# Patient Record
Sex: Male | Born: 1990 | Race: White | Hispanic: No | Marital: Single | Smoking: Never smoker
Health system: Southern US, Community
[De-identification: ages and names within clinical notes are randomized; demographics above are authoritative.]

## PROBLEM LIST (undated history)

## (undated) DIAGNOSIS — J309 Allergic rhinitis, unspecified: Secondary | ICD-10-CM

## (undated) DIAGNOSIS — J45909 Unspecified asthma, uncomplicated: Secondary | ICD-10-CM

## (undated) HISTORY — PX: OTHER SURGICAL HISTORY: SHX169

---
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
## 2009-10-03 ENCOUNTER — Emergency Department (HOSPITAL_COMMUNITY): Admission: EM | Admit: 2009-10-03 | Discharge: 2009-10-03 | Payer: Self-pay | Admitting: Family Medicine

## 2009-10-03 ENCOUNTER — Emergency Department (HOSPITAL_COMMUNITY): Admission: EM | Admit: 2009-10-03 | Discharge: 2009-10-03 | Payer: Self-pay | Admitting: Emergency Medicine

## 2010-05-22 LAB — HEPATIC FUNCTION PANEL
ALT: 47 U/L (ref 0–53)
AST: 30 U/L (ref 0–37)
Albumin: 3.2 g/dL — ABNORMAL LOW (ref 3.5–5.2)
Alkaline Phosphatase: 63 U/L (ref 39–117)
Bilirubin, Direct: 0.2 mg/dL (ref 0.0–0.3)
Indirect Bilirubin: 0.7 mg/dL (ref 0.3–0.9)
Total Bilirubin: 0.9 mg/dL (ref 0.3–1.2)
Total Protein: 7.6 g/dL (ref 6.0–8.3)

## 2010-05-22 LAB — BASIC METABOLIC PANEL
BUN: 8 mg/dL (ref 6–23)
CO2: 28 mEq/L (ref 19–32)
Calcium: 8.8 mg/dL (ref 8.4–10.5)
Chloride: 101 mEq/L (ref 96–112)
Creatinine, Ser: 0.74 mg/dL (ref 0.4–1.5)
GFR calc Af Amer: >60 mL/min (ref >60)
GFR calc non Af Amer: >60 mL/min (ref >60)
Glucose, Bld: 85 mg/dL (ref 70–99)
Potassium: 4.2 mEq/L (ref 3.5–5.1)
Sodium: 136 mEq/L (ref 135–145)

## 2010-05-22 LAB — LIPASE, BLOOD: Lipase: 21 U/L (ref 11–59)

## 2010-05-22 LAB — CBC
HCT: 26 % — ABNORMAL LOW (ref 39.0–52.0)
Hemoglobin: 8.9 g/dL — ABNORMAL LOW (ref 13.0–17.0)
MCH: 31.1 pg (ref 26.0–34.0)
MCHC: 34.3 g/dL (ref 30.0–36.0)
MCV: 90.9 fL (ref 78.0–100.0)
Platelets: 467 10*3/uL — ABNORMAL HIGH (ref 150–400)
RBC: 2.86 MIL/uL — ABNORMAL LOW (ref 4.22–5.81)
RDW: 12.5 % (ref 11.5–15.5)
WBC: 11.7 10*3/uL — ABNORMAL HIGH (ref 4.0–10.5)

## 2010-05-22 LAB — ABO/RH: ABO/RH(D): A POS

## 2010-05-22 LAB — URINALYSIS, ROUTINE W REFLEX MICROSCOPIC
Bilirubin Urine: NEGATIVE
Glucose, UA: NEGATIVE mg/dL
Hgb urine dipstick: NEGATIVE
Ketones, ur: NEGATIVE mg/dL
Nitrite: NEGATIVE
Protein, ur: NEGATIVE mg/dL
Specific Gravity, Urine: >1.046 — ABNORMAL HIGH (ref 1.005–1.030)
Urobilinogen, UA: 0.2 mg/dL (ref 0.0–1.0)
pH: 6.5 (ref 5.0–8.0)

## 2010-05-22 LAB — DIFFERENTIAL
Basophils Absolute: 0.1 10*3/uL (ref 0.0–0.1)
Basophils Relative: 1 % (ref 0–1)
Eosinophils Absolute: 0.5 10*3/uL (ref 0.0–0.7)
Eosinophils Relative: 4 % (ref 0–5)
Lymphocytes Relative: 13 % (ref 12–46)
Lymphs Abs: 1.6 10*3/uL (ref 0.7–4.0)
Monocytes Absolute: 1.8 10*3/uL — ABNORMAL HIGH (ref 0.1–1.0)
Monocytes Relative: 15 % — ABNORMAL HIGH (ref 3–12)
Neutro Abs: 7.8 10*3/uL — ABNORMAL HIGH (ref 1.7–7.7)
Neutrophils Relative %: 66 % (ref 43–77)

## 2010-05-22 LAB — TYPE AND SCREEN
ABO/RH(D): A POS
Antibody Screen: NEGATIVE

## 2010-11-30 LAB — CBC
HCT: 40.9
Hemoglobin: 14.3
MCHC: 34.9
MCV: 90.4
Platelets: 205
RBC: 4.52
RDW: 11.6
WBC: 5.5

## 2010-11-30 LAB — COMPREHENSIVE METABOLIC PANEL
ALT: 17
AST: 19
Albumin: 3.5
Alkaline Phosphatase: 55
BUN: 8
CO2: 30
Calcium: 9.1
Chloride: 102
Creatinine, Ser: 1.15
GFR calc Af Amer: >60
GFR calc non Af Amer: >60
Glucose, Bld: 105 — ABNORMAL HIGH
Potassium: 4.2
Sodium: 139
Total Bilirubin: 0.5
Total Protein: 7

## 2010-11-30 LAB — DIFFERENTIAL
Basophils Absolute: 0
Basophils Relative: 0
Eosinophils Absolute: 0.1
Eosinophils Relative: 2
Lymphocytes Relative: 26
Lymphs Abs: 1.4
Monocytes Absolute: 0.8
Monocytes Relative: 14 — ABNORMAL HIGH
Neutro Abs: 3.2
Neutrophils Relative %: 58

## 2010-11-30 LAB — LIPASE, BLOOD: Lipase: 15

## 2010-11-30 LAB — POCT RAPID STREP A: Streptococcus, Group A Screen (Direct): POSITIVE — AB
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
## 2012-04-21 ENCOUNTER — Emergency Department (HOSPITAL_COMMUNITY)
Admission: EM | Admit: 2012-04-21 | Discharge: 2012-04-23 | Disposition: A | Discharge status: Other Acute Inpatient Hospital | Payer: 59 | Attending: Emergency Medicine | Admitting: Emergency Medicine

## 2012-04-21 ENCOUNTER — Encounter (HOSPITAL_COMMUNITY): Payer: Self-pay | Admitting: *Deleted

## 2012-04-21 ENCOUNTER — Ambulatory Visit (HOSPITAL_COMMUNITY)
Admission: AD | Admit: 2012-04-21 | Discharge: 2012-04-21 | Disposition: A | Discharge status: Home / Self Care | Payer: 59 | Attending: Psychiatry | Admitting: Psychiatry

## 2012-04-21 DIAGNOSIS — F918 Other conduct disorders: Secondary | ICD-10-CM | POA: Insufficient documentation

## 2012-04-21 DIAGNOSIS — F1210 Cannabis abuse, uncomplicated: Secondary | ICD-10-CM

## 2012-04-21 DIAGNOSIS — J45909 Unspecified asthma, uncomplicated: Secondary | ICD-10-CM | POA: Insufficient documentation

## 2012-04-21 DIAGNOSIS — R454 Irritability and anger: Secondary | ICD-10-CM

## 2012-04-21 DIAGNOSIS — Z79899 Other long term (current) drug therapy: Secondary | ICD-10-CM | POA: Insufficient documentation

## 2012-04-21 HISTORY — DX: Allergic rhinitis, unspecified: J30.9

## 2012-04-21 HISTORY — DX: Unspecified asthma, uncomplicated: J45.909

## 2012-04-21 LAB — COMPREHENSIVE METABOLIC PANEL
ALT: 54 U/L — ABNORMAL HIGH (ref 0–53)
AST: 29 U/L (ref 0–37)
Albumin: 4.3 g/dL (ref 3.5–5.2)
Alkaline Phosphatase: 72 U/L (ref 39–117)
BUN: 9 mg/dL (ref 6–23)
CO2: 26 mEq/L (ref 19–32)
Calcium: 9.6 mg/dL (ref 8.4–10.5)
Chloride: 101 mEq/L (ref 96–112)
Creatinine, Ser: 0.94 mg/dL (ref 0.50–1.35)
GFR calc Af Amer: >90 mL/min (ref >90)
GFR calc non Af Amer: >90 mL/min (ref >90)
Glucose, Bld: 92 mg/dL (ref 70–99)
Potassium: 3.6 mEq/L (ref 3.5–5.1)
Sodium: 139 mEq/L (ref 135–145)
Total Bilirubin: 0.7 mg/dL (ref 0.3–1.2)
Total Protein: 8.3 g/dL (ref 6.0–8.3)

## 2012-04-21 LAB — CBC
HCT: 42.1 % (ref 39.0–52.0)
Hemoglobin: 15.4 g/dL (ref 13.0–17.0)
MCH: 32.6 pg (ref 26.0–34.0)
MCHC: 36.6 g/dL — ABNORMAL HIGH (ref 30.0–36.0)
MCV: 89.2 fL (ref 78.0–100.0)
Platelets: 335 10*3/uL (ref 150–400)
RBC: 4.72 MIL/uL (ref 4.22–5.81)
RDW: 11.6 % (ref 11.5–15.5)
WBC: 10 10*3/uL (ref 4.0–10.5)

## 2012-04-21 LAB — RAPID URINE DRUG SCREEN, HOSP PERFORMED
Amphetamines: POSITIVE — AB
Barbiturates: NOT DETECTED
Benzodiazepines: NOT DETECTED
Cocaine: NOT DETECTED
Opiates: NOT DETECTED
Tetrahydrocannabinol: POSITIVE — AB

## 2012-04-21 MED ORDER — LORAZEPAM 1 MG PO TABS
1.0000 mg | ORAL_TABLET | Freq: Three times a day (TID) | ORAL | Status: DC | PRN
  Administered 2012-04-21 – 2012-04-22 (×2): 1 mg via ORAL
  Filled 2012-04-21 (×2): qty 1

## 2012-04-21 MED ORDER — ZOLPIDEM TARTRATE 5 MG PO TABS
5.0000 mg | ORAL_TABLET | Freq: Every evening | ORAL | Status: DC | PRN
  Administered 2012-04-21 – 2012-04-22 (×2): 5 mg via ORAL
  Filled 2012-04-21 (×2): qty 1

## 2012-04-21 MED ORDER — IBUPROFEN 600 MG PO TABS
600.0000 mg | ORAL_TABLET | Freq: Three times a day (TID) | ORAL | Status: DC | PRN
  Administered 2012-04-22 – 2012-04-23 (×3): 600 mg via ORAL
  Filled 2012-04-21 (×3): qty 1

## 2012-04-21 MED ORDER — ONDANSETRON HCL 4 MG PO TABS: 4.0000 mg | ORAL_TABLET | Freq: Three times a day (TID) | ORAL | Status: DC | PRN

## 2012-04-21 MED ORDER — ACETAMINOPHEN 325 MG PO TABS: 650.0000 mg | ORAL_TABLET | ORAL | Status: DC | PRN

## 2012-04-21 MED ORDER — ALUM & MAG HYDROXIDE-SIMETH 200-200-20 MG/5ML PO SUSP: 30.0000 mL | ORAL | Status: DC | PRN

## 2012-04-21 NOTE — BH Assessment (Signed)
Assessment Note   [facility] is an 22 y.o. male, single, white, who presents to [facility] accompanied by father and girlfriend who, at Pt's request, did not participate in the assessment. Pt reports he has difficulty controlling his anger and he becomes frustrated easily. He states he is here today because he became upset last night with his father and girlfriend and had an anger outburst. He states he was irritable because he has a school assignment due Monday and couldn't concentrate. He became belligerent towards his girlfriend and when she decided to go home because of his behavior he lost his temper with her and his family. He states he yelled, said "things I didn't mean, but I know how to press their buttons," threw his glasses and his cell phone and hit walls. He says he made suicidal statements but only said them because he was angry, frustrated and knew it would get a reaction from his family and girlfriend. He states he felt guilty about his "tantrum" and agreed to get help because he doesn't want to act this way anymore. Pt states "When I was a teenager I would get mad and act out but now I'm 22 and I can't keep acting that way."   Pt denies feeling sad or depressed except after he has an anger outburst. He denies problems with sleep or appetite. He denies feelings of hopelessness. Pt denies current suicidal ideation or any history of suicidal gestures. He denies any history of intentional self-harm but states he has unintentionally hurt himself by punching walls. He denies current homicidal ideations but acknowledges making homicidal threats when angry. He denies ever being assaultive towards people but says he becomes verbally and physically threatening. He denies any psychotic symptoms but "my parents tell me all the time that I am paranoid. Everyone tip toes around me so they don't make me angry, but that makes me angry. I get mad because they don't understand what I am going through."   Pt  states he is taking more Vyvanse than prescribed. His physician, Dr. Cyndia [facility], prescribes 60 mg daily and Pt states he often takes two per day. He reports that he was diagnosed with ADD and prescribed Vyvanse when he was 16 and it was very helpful. Over the past year he says it is no longer effective, "I can take a Vyvanse and go to sleep." He states he cannot concentrate, which is a source of frustration. He says he spoke to Dr. Cyndia [facility] about this and was prescribed methylphenidate but due to problems with his insurance he has not been able to fill the prescription. Pt states his family thinks he is addicted to Vyvanse "but they don't understand my ADD and that I have to study." Pt also reports he has been smoking marijuana daily since he was age 37 and uses it to "calm down and control my anger." He denies abusing alcohol or using any other substances.  Pt states his primary stressor is school. 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 He states he cannot focus. He reports frequent conflicts with his girlfriend and parents as also being stressful. Pt denies legal problems.  Pt states he was in outpatient treatment for "anger problems" with Billy Mechanic, PhD, [facility] but stopped going in December. Pt states he was referred to Billy Amass, MD who diagnosed him with bipolar disorder and wanted to prescribe mood stabilizers. Pt states he doesn't believe he has bipolar  disorder because he doesn't feel he has manic episodes. He was prescribed a mood stabilizer as an adolescent and "all it made me do was gain weight." He denies any previous inpatient psychiatric treatment.  This LPC consulted with Billy Found, FNP who agreed Pt did not meet criteria for inpatient treatment. [facility] performed medical screening exam. Pt contracted for safety and agreed to follow up with outpatient referrals.   As Pt was leaving his father and girlfriend said  they needed to speak with this Clinical research associate. The Pt, father and girlfriend sat in the consult room and father and girlfriend said Pt had made both suicidal and homicidal threats and they were very concerned for his and their safety. Pt immediately became furious and started yelling at his father that they just wanted to have him locked up. Pt's father and girlfriend calmly expressed concern that he was abusing Vyvanse and that he had used a month's prescription in two weeks. Pt was yelling "you think I'm a meth addict!" Pt's father tried to calm Pt but Pt continued to verbally escalate. Pt's father stated that Pt had made threats last night to shoot his brother in the head and Pt yelled "No! I said I would shoot YOU in the head!" Pt's girlfriend tried to explain that she couldn't deal with Pt's anger outburst because her father had problems with anger outbursts and committed suicide. Pt jumped up, walked into the main hallway and said "Fine! I'll just kill myself!" and walked to the [facility] doors yelling "Go ahead! Lock me up! Do it!"   Pt was directed back to the consult room. Security officer, [facility] was notified to not allow Pt to leave the building. Consulted with [facility] and explained Pt needed to be placed under involuntary commitment. Consulted with [facility], [facility] who confirmed no [facility] bed was available. Security officer said Pt had tried to leave and was directed back to the consult room. This LPC called 911 and requested law enforcement assistance while IVC was processed.  Consulted with Dr. Orson [facility] who agreed to evaluate Pt and petition for involuntary commitment if necessary. After assessing Pt, Dr. Dan [facility] agreed Pt met criteria for involuntary commitment and signed IVC paperwork, however there was no notary in the building and no notary at [facility] available to come to [facility]. Pt was observed in the consult room with his chair turned to face the corner. This LPC drove to the magistrate's  office and petitioned for involuntary commitment.  Called [facility] and gave report to both Patty, Consulting civil engineer at [employer] and [employer]. Pt transported to [facility] by [employer]. Pt has not been accepted nor declined at [facility] at this time and should be considered for admission when he is medically cleared and a bed is available.    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 Asthma   . Allergic rhinitis, cause unspecified seaasonal    Allergic rhinitis   Axis IV: educational problems and problems with primary support group Axis V: GAF=35  Past Medical History:  Past Medical History  Diagnosis Date  . Asthma   . Allergic rhinitis, cause unspecified seaasonal    Allergic rhinitis    Past Surgical History  Procedure Laterality Date  . Other surgical history      sinus surgery  . Adnoids      adnoids removed  . Other surgical history  tubes placed in ears    Family History: No family history on file.  Social History:  reports that he has never smoked. He does not have any smokeless tobacco history on file. He reports that he uses illicit drugs (Marijuana). He reports that he does not drink alcohol.  Additional Social History:  Alcohol / Drug Use Pain Medications: Denies Prescriptions: Vyvanse Over the Counter: Denies History of alcohol / drug use?: Yes Substance #1 Name of Substance 1: Vyvanse 1 - Age of First Use: 16 1 - Amount (size/oz): 120 mg 1 - Frequency: daily 1 - Duration: 6 years 1 - Last Use / Amount: 04/20/2012, 60 mg Substance #2 Name of Substance 2: Marijuana 2 - Age of First Use: 13 2 - Amount (size/oz): 1 gram 2 - Frequency: daily 2 - Duration: 9 years 2 - Last Use / Amount: 04/20/2012, 1 gram  CIWA:   COWS:    Allergies:  Allergies  Allergen Reactions  . Amoxicillin Hives  . Cefzil (Cefprozil) Hives  . Septra (Sulfamethoxazole-Tmp Ds)  Hives    Home Medications:  (Not in a [facility] admission)  OB/GYN Status:  No LMP for male patient.  General Assessment Data Location of Assessment: [facility] Living Arrangements: Parent (Parents and brother (20)) Can pt return to current living arrangement?: Yes Admission Status: Involuntary (Voluntary on arrival) Is patient capable of signing voluntary admission?: Yes Transfer from: Home Referral Source: Self/Family/Friend  Education Status Is patient currently in school?: Yes Current Grade: 17 Highest grade of school patient has completed: 82 Name of school: [employer] person: Unknown  Risk to self Suicidal Ideation: Yes-Currently Present Suicidal Intent: No Is patient at risk for suicide?: Yes Suicidal Plan?: No Access to Means: No What has been your use of drugs/alcohol within the last 12 months?: Pt is abusing Vyvanse and marijuana daily Previous Attempts/Gestures: No How many times?: 0 Other Self Harm Risks: Pt will bang his head and hit walls Triggers for Past Attempts: Other personal contacts;Family contact Intentional Self Injurious Behavior: None Family Suicide History: No Recent stressful life event(s): Other (Comment) (School and relationship stress) Persecutory voices/beliefs?: No Depression: Yes Depression Symptoms: Feeling angry/irritable;Guilt Substance abuse history and/or treatment for substance abuse?: No Suicide prevention information given to non-admitted patients: Not applicable  Risk to Others Homicidal Ideation: Yes-Currently Present Thoughts of Harm to Others: Yes-Currently Present Comment - Thoughts of Harm to Others: Pt threatened to shoot his father in the head. Current Homicidal Intent: No Current Homicidal Plan: Yes-Currently Present Describe Current Homicidal Plan: Shoot his father Access to Homicidal Means: No (Pt denies access to a gun) Describe Access to Homicidal Means: Pt denies having a gun in the  home Identified Victim: Father History of harm to others?: No Assessment of Violence: On admission Violent Behavior Description: Pt destroyed property at his home, punched walls Does patient have access to weapons?: No Criminal Charges Pending?: No Does patient have a court date: No  Psychosis Hallucinations: None noted Delusions: None noted  Mental Status Report Appear/Hygiene: Other (Comment) (Casually dressed) Eye Contact: Good Motor Activity: Agitation;Restlessness Speech: Aggressive;Loud;Abusive Level of Consciousness: Alert;Irritable Mood: Angry;Guilty;Sullen Affect: Angry;Labile;Threatening Anxiety Level: Severe Thought Processes: Coherent;Relevant Judgement: Unimpaired Orientation: Person;Place;Time;Situation Obsessive Compulsive Thoughts/Behaviors: None  Cognitive Functioning Concentration: Decreased Memory: Recent Intact;Remote Intact IQ: Average Insight: Fair Impulse Control: Poor Appetite: Good Weight Loss: 0 Weight Gain: 0 Sleep: No Change Total Hours of Sleep: 8 Vegetative Symptoms: None  ADLScreening [facility]) Patient's cognitive ability adequate to safely complete daily  activities?: Yes Patient able to express need for assistance with ADLs?: Yes Independently performs ADLs?: Yes (appropriate for developmental age)  Abuse/Neglect [facility]) Physical Abuse: Denies Verbal Abuse: Denies Sexual Abuse: Denies  Prior Inpatient Therapy Prior Inpatient Therapy: No Prior Therapy Dates: None Prior Therapy Facilty/Provider(s): None Reason for Treatment: None  Prior Outpatient Therapy Prior Outpatient Therapy: Yes Prior Therapy Dates: 2013 Prior Therapy Facilty/Provider(s): [facility] Mechanic, PhD, [facility] Reason for Treatment: "anger problems"  ADL Screening (condition at time of admission) Patient's cognitive ability adequate to safely complete daily activities?: Yes Patient able to express need for assistance with ADLs?: Yes Independently  performs ADLs?: Yes (appropriate for developmental age) Weakness of Legs: None Weakness of Arms/Hands: None  Home Assistive Devices/Equipment Home Assistive Devices/Equipment: None    Abuse/Neglect Assessment (Assessment to be complete while patient is alone) Physical Abuse: Denies Verbal Abuse: Denies Sexual Abuse: Denies Exploitation of patient/patient's resources: Denies Self-Neglect: Denies     Advance Directives (For Healthcare) Advance Directive: Patient does not have advance directive;Patient would not like information Pre-existing out of facility DNR order (yellow form or pink MOST form): No Nutrition Screen- [facility] Patient's home diet: Regular Have you recently lost weight without trying?: No Have you been eating poorly because of a decreased appetite?: No Malnutrition Screening Tool Score: 0  Additional Information 1:1 In Past 12 Months?: No CIRT Risk: Yes Elopement Risk: Yes Does patient have medical clearance?: No     Disposition:  Disposition Disposition of Patient: Inpatient treatment program Type of inpatient treatment program: [facility] (Transferred to [facility] for medical clearance and holding)  On Site Evaluation by:   Reviewed with Physician: [facility] Found, FNP and [facility] Aloe, MD    Patsy Baltimore, Harlin Rain 04/21/2012 9:19 PM

## 2012-04-21 NOTE — ED Notes (Signed)
Pt informed that he has already been assessed at [facility] and they are waiting to run his info by MD for a bed. Pt asked how long he'll have to stay there advised it's different for everyone and it depends on how he interacts with his psychiatrist there and his participation in groups and activities. Pt remained calm and took the medications offered for sleep.

## 2012-04-21 NOTE — ED Notes (Signed)
Stated his dad said he needed help with his temper. Denies SI/HI
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
## 2012-04-22 MED ORDER — LAMOTRIGINE 25 MG PO TABS
25.0000 mg | ORAL_TABLET | Freq: Every day | ORAL | Status: DC
  Administered 2012-04-22 – 2012-04-23 (×2): 25 mg via ORAL
  Filled 2012-04-22 (×2): qty 1

## 2012-04-22 NOTE — ED Provider Notes (Signed)
[facility] is a 22 y.o. male here with IVC for agitation. Telepsych recommend admission. Patient denied at [facility] so will try other facilities. Sleeping comfortably this AM. No issues as per nursing overnight. Now pending psych placement.    Richardean Canal, MD 04/22/12 [phone]

## 2012-04-22 NOTE — Progress Notes (Signed)
Pt declined at present by [facility] and ACT will seek alternative placements since Tele Psych is recommending [facility].  Pt will be evaluated face to face on Monday AM by Dr. Queen Slough when he conducts rounds.  If pt still meets criteria placement pursuits will continue if not, the psychiatrist will make the appropriate recommendation.  Pt already assessed by ACT at [facility] and assessment in [facility]

## 2012-04-22 NOTE — BHH Counselor (Signed)
Patient has been declined at [facility] by Verne Spurr PA due to patient acuity. Patient's mood is too labile and unpredictable, he needs outpatient DBT. We have no programming for vyvanse abuse.

## 2012-04-22 NOTE — BHH Counselor (Signed)
First opinion completed and placed in pt's chart.  Evette Cristal, [place] Assessment Counselor

## 2012-04-22 NOTE — ED Notes (Signed)
telepsych consult called and faxed.

## 2012-04-22 NOTE — Progress Notes (Signed)
Nurse asked ACT to speak to pt father about options.  ACT was limited on what could be discussed due to confidentiality issues, but listened to father.  His concern was waiting in the ED and not getting tx he believed his son needed.  ACT affirmed father's perspective.  ACT indicated more would occur Monday and efforts would be made today.  Father did not want his son to "talk his way out of this and be discharged."  Father reported pt is followed by Sanjuana Letters at [facility].    ACT attempted to offer support to father of pt without discussing specifics of case with him without pt consent.  ED may get a consent to release signed by pt to discuss issues more openly with family.    Current plan is to pursue other [facility] facilities and perhaps revisit [facility] option if pt is cooperative in ED.

## 2012-04-23 NOTE — ED Notes (Addendum)
Chaplain saw pt on rounds.  Pt's girlfriend at bedside.  Chaplain introduced spiritual care as resource and provided support around pt's admission and transfer.  Pt is Administrator at [employer].  Has contacted professors to inform that he is in hospital.   Pt concerned about length of stay.  Informed pt that length of stay varied and that most important aspect was to be open with team about how he is feeling.   Will continue to follow as needed.  Please page as needs arise.    Belva Crome  MDiv

## 2012-04-23 NOTE — Progress Notes (Signed)
Pt received breakfast tray 

## 2012-04-23 NOTE — ED Provider Notes (Signed)
Pt accepted at [facility] by Dr Lester Kinsman, MD 04/23/12 [phone]

## 2012-04-23 NOTE — ED Provider Notes (Signed)
Patient sleeping but easily awakened. He states he feels fine. Evidently he had made threats to harm his family and also made suicidal statements.  According to the act team patient has been accepted at [facility] later this morning. Patient was informed of his admission and he seems agreeable. Patient is under IVC papers.  Devoria Albe, MD, FACEP   Ward Givens, MD 04/23/12 [phone]

## 2012-04-23 NOTE — BHH Counselor (Signed)
Per Christiane Ha, pt accepted to wait list by Dr. Les Pou. There will be a lot of discharges tomorrow am. Therefore, pt can be transported after 10 am. [phone] is # to call report. Pt will be going to [employer] at [employer].   Evette Cristal, [place] Assessment Counselor

## 2012-04-30 ENCOUNTER — Ambulatory Visit (HOSPITAL_COMMUNITY)
Admission: EM | Admit: 2012-04-30 | Discharge: 2012-04-30 | Disposition: A | Discharge status: Home / Self Care | Payer: 59 | Attending: Psychiatry | Admitting: Psychiatry

## 2012-04-30 DIAGNOSIS — F39 Unspecified mood [affective] disorder: Secondary | ICD-10-CM | POA: Insufficient documentation

## 2012-04-30 NOTE — BH Assessment (Signed)
Assessment Note   Billy Cooley is an 22 y.o. male. Pt reports he became agitated and verbally threatening to his father and his girlfriend on 04/21/12,  He also made suicidal threats. He has been abusing Vyvance since age 4 and has been seeing a therapist for his anger and ADHD problems for the past 6 years.  He was assessed at this facility and was sent to [facility] for inpatient treatment and was discharged on 04/27/12. Prior to his discharge arrangements were made for pt to attend Psych-iop and he had an appointment today to be assessed and to start psych-iop tomorrow.  No MSE needed.Pt reports he's had temper tantrum all his life and the abuse of th medication has kept him confused alone with the abuse of marijuana.  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  He has since taken a medical leave for school to get help. Pt denies being depressed but does want to change his ways of dealing with life stressors especially school.He denies h/i and is not psychotic. Pt did admit to smoking 1 marijuana joint last night..  He may subsequent need help dealing with substance abuse upon completion of this program.  Pt was given a suicide precaution pamphlet.      Axis I: Mood Disorder NOS Axis II: Deferred Axis III:  Past Medical History  Diagnosis Date  . Asthma   . Allergic rhinitis, cause unspecified seaasonal    Allergic rhinitis   Axis IV: educational problems, problems related to social environment and problems with primary support group Axis V: 51-60 moderate symptoms      Past Medical History:  Past Medical History  Diagnosis Date  . Asthma   . Allergic rhinitis, cause unspecified seaasonal    Allergic rhinitis    Past Surgical History  Procedure Laterality Date  . Other surgical history      sinus surgery  . Adnoids      adnoids removed  . Other surgical  history      tubes placed in ears    Family History: No family history on file.  Social History:  reports that he has never smoked. He does not have any smokeless tobacco history on file. He reports that he uses illicit drugs (Marijuana). He reports that he does not drink alcohol.  Additional Social History:  Alcohol / Drug Use Pain Medications: na Prescriptions: yes Over the Counter: na History of alcohol / drug use?: Yes Negative Consequences of Use: Personal relationships Withdrawal Symptoms: Agitation;Aggressive/Assaultive;Irritability;Patient aware of relationship between substance abuse and physical/medical complications Substance #1 Name of Substance 1: vyvance 1 - Age of First Use: 16 1 - Amount (size/oz): 120mg  1 - Frequency: daily 1 - Duration: 6 years 1 - Last Use / Amount: 04/20/12 Substance #2 Name of Substance 2: marijuana 2 - Age of First Use: 13 2 - Amount (size/oz): 1 gram 2 - Frequency: daily 2 - Duration: 9 years 2 - Last Use / Amount: 04/29/12  1 joint  CIWA:   COWS:    Allergies:  Allergies  Allergen Reactions  . Amoxicillin Hives  . Cefzil (Cefprozil) Hives  . Septra (Sulfamethoxazole-Tmp Ds) Hives    Home Medications:  (Not in a hospital admission)  OB/GYN Status:  No LMP for male patient.  General Assessment Data Location of Assessment: [facility] Assessment Services Living Arrangements: Parent Can pt  return to current living arrangement?: Yes Admission Status: Voluntary Is patient capable of signing voluntary admission?: Yes Transfer from: Home Referral Source: Other ([facility])  Education Status Highest grade of school patient has completed: 69 Name of school: [employer] person: Unknown  Risk to self Suicidal Ideation: No Suicidal Intent: No Is patient at risk for suicide?: No Suicidal Plan?: No Access to Means: No What has been your use of drugs/alcohol within the last 12 months?: VYVANCE,THC Previous  Attempts/Gestures: No How many times?: 0 Other Self Harm Risks: NA Triggers for Past Attempts: Other personal contacts;Family contact Intentional Self Injurious Behavior: None Family Suicide History: No Recent stressful life event(s): Conflict (Comment) (POOR ATTITUDE WITH FAMILY AND GIRLFRIEND) Persecutory voices/beliefs?: No Depression: No Depression Symptoms: Feeling angry/irritable Substance abuse history and/or treatment for substance abuse?: Yes Suicide prevention information given to non-admitted patients: Yes  Risk to Others Homicidal Ideation: No Thoughts of Harm to Others: No-Not Currently Present/Within Last 6 Months Comment - Thoughts of Harm to Others: WAS THREATENING FAMILY & GIRLFRIEND 04/21/12 Current Homicidal Intent: No Current Homicidal Plan: No Describe Current Homicidal Plan: NA Access to Homicidal Means: No Describe Access to Homicidal Means: NONE Identified Victim: NA History of harm to others?: No Assessment of Violence: None Noted Violent Behavior Description: NA Does patient have access to weapons?: No Criminal Charges Pending?: No Does patient have a court date: No  Psychosis Hallucinations: None noted Delusions: None noted  Mental Status Report Appear/Hygiene: Improved Eye Contact: Good Motor Activity: Freedom of movement Speech: Logical/coherent;Soft Level of Consciousness: Alert Mood: Other (Comment) (APPROPRIATE) Affect: Appropriate to circumstance Anxiety Level: Minimal Thought Processes: Coherent;Relevant Judgement: Unimpaired Orientation: Person;Place;Time;Situation Obsessive Compulsive Thoughts/Behaviors: None  Cognitive Functioning Concentration: Normal Memory: Recent Intact;Remote Intact IQ: Average Insight: Good Impulse Control: Good Appetite: Good Weight Loss:  (0) Weight Gain: 0 Sleep: No Change Total Hours of Sleep: 8 Vegetative Symptoms: None  ADLScreening [facility] Assessment Services) Patient's cognitive ability  adequate to safely complete daily activities?: Yes Patient able to express need for assistance with ADLs?: Yes Independently performs ADLs?: Yes (appropriate for developmental age)  Abuse/Neglect [facility]) Physical Abuse: Denies Verbal Abuse: Denies Sexual Abuse: Denies  Prior Inpatient Therapy Prior Inpatient Therapy: Yes Prior Therapy Dates: FEB 17-21, 2014 Prior Therapy Facilty/Provider(s): [facility] Reason for Treatment: MOOD D/O  Prior Outpatient Therapy Prior Outpatient Therapy: Yes Prior Therapy Dates: PAST 6 YEARS  Prior Therapy Facilty/Provider(s): Eyvonne Mechanic, PhD, [facility] Reason for Treatment: "anger problems"  ADL Screening (condition at time of admission) Patient's cognitive ability adequate to safely complete daily activities?: Yes Patient able to express need for assistance with ADLs?: Yes Independently performs ADLs?: Yes (appropriate for developmental age) Weakness of Legs: None Weakness of Arms/Hands: None     Therapy Consults (therapy consults require a physician order) PT Evaluation Needed: No OT Evalulation Needed: No SLP Evaluation Needed: No Abuse/Neglect Assessment (Assessment to be complete while patient is alone) Physical Abuse: Denies Verbal Abuse: Denies Sexual Abuse: Denies Exploitation of patient/patient's resources: Denies Self-Neglect: Denies Values / Beliefs Cultural Requests During Hospitalization: None Spiritual Requests During Hospitalization: None Consults Spiritual Care Consult Needed: No Social Work Consult Needed: No Merchant navy officer (For Healthcare) Advance Directive: Patient does not have advance directive;Patient would not like information Pre-existing out of facility DNR order (yellow form or pink MOST form): No    Additional Information 1:1 In Past 12 Months?: No CIRT Risk: No Elopement Risk: No Does patient have medical clearance?: No     Disposition:  Disposition Disposition  of Patient: Outpatient  treatment Type of outpatient treatment: Psych Intensive Outpatient  On Site Evaluation by:   Reviewed with Physician:     Hattie Perch Winford 04/30/2012 3:36 PM

## 2012-05-01 ENCOUNTER — Encounter (HOSPITAL_COMMUNITY): Payer: Self-pay

## 2012-05-01 ENCOUNTER — Other Ambulatory Visit (HOSPITAL_COMMUNITY): Payer: 59 | Attending: Psychiatry | Admitting: Psychiatry

## 2012-05-01 DIAGNOSIS — F329 Major depressive disorder, single episode, unspecified: Secondary | ICD-10-CM | POA: Insufficient documentation

## 2012-05-01 DIAGNOSIS — F3162 Bipolar disorder, current episode mixed, moderate: Secondary | ICD-10-CM

## 2012-05-01 DIAGNOSIS — F1210 Cannabis abuse, uncomplicated: Secondary | ICD-10-CM

## 2012-05-01 DIAGNOSIS — F39 Unspecified mood [affective] disorder: Secondary | ICD-10-CM | POA: Insufficient documentation

## 2012-05-01 NOTE — Progress Notes (Signed)
Patient ID: BEVERLEY SHERRARD, male   DOB: 1990-03-13, 22 y.o.   MRN: [id number] D:  This is a 22 yr old single caucasian male, who was transitioned from [place].  He was admitted there for four days due to severe agitation and communicating a threat.  States he made a threat to harm his girlfriend, father and himself.  Denies any SI/HI or A/V hallucinations.  Triggers/Stressors:  1)  Medication non-compliance:  Pt was abusing his Vyvanse.  Reports that he supposed to have been taking one tablet, but he was taking 2-3 tabs.  States he had been abusing it for ~ six months.  2)  Graduate school ([employer]:  Majoring in [place].  Due to poor grades/GPA (2.7) pt has lost his stipend.  3)  During Christmas break moved back in with parents due to conflict with roommate.  "Being back home has been a huge adjustment."  States he and parents argue a lot. Childhood:  "Good"  Denies any trauma or abuse.  Dx with ADD during senior year. Sibling:  Younger brother who also resides at parent's home. Drugs/ETOH:  Denies any ETOH.  Admits to history of using various drugs.  Currently admits to [facility] use.  Started smoking at age 51.  Use to smoke two grams.  Last time smoking two grams was two weeks ago.  States he smokes less than one gram now.  Admits to smoking THC this morning before coming to IOP. Pt has been seeing Drs. Tomasa Rand and [facility] on an outpatient basis for five years.  Pt will attend [facility]-IOP for two weeks.  Pt completed all forms.  Scored 7 on the burns.   A:  Oriented pt.  Informed Drs. Tomasa Rand and [facility] [place] of admit.  Encouraged support groups.  R:  Pt receptive.

## 2012-05-01 NOTE — Progress Notes (Signed)
    Daily Group Progress Note  Program: IOP  Group Time: 9:00-10:30 am   Participation Level: Minimal  Behavioral Response: Appropriate  Type of Therapy:  Process Group  Summary of Progress: Today was patients first day in the group. He was introduced, was attentive and observed the group process.      Group Time: 10:30 am - 12:00 pm   Participation Level:  Active  Behavioral Response: Appropriate  Type of Therapy: Psycho-education Group  Summary of Progress: Patient learned about the symptoms of depression and how to identify them and act early to prevent relapses.   Carman Ching, LCSW

## 2012-05-01 NOTE — Progress Notes (Signed)
Patient ID: Billy Cooley, male   DOB: 05-25-1990, 22 y.o.   MRN: [id number] D:  Placed call to [facility], spoke to Terri re: pre-cert.  She auth'd twelve MH-IOP days with auth # [id number].

## 2012-05-02 ENCOUNTER — Other Ambulatory Visit (HOSPITAL_COMMUNITY): Payer: 59 | Admitting: Psychiatry

## 2012-05-02 DIAGNOSIS — F329 Major depressive disorder, single episode, unspecified: Secondary | ICD-10-CM

## 2012-05-03 ENCOUNTER — Other Ambulatory Visit (HOSPITAL_COMMUNITY): Payer: 59 | Admitting: Psychiatry

## 2012-05-03 DIAGNOSIS — F329 Major depressive disorder, single episode, unspecified: Secondary | ICD-10-CM

## 2012-05-03 NOTE — Progress Notes (Signed)
    Daily Group Progress Note  Program: IOP  Group Time: 9:00-10:30 am   Participation Level: Active  Behavioral Response: Appropriate  Type of Therapy:  Psycho-education Group  Summary of Progress: Patient participated in a discussion Bipolar Disorder and Depression and shared personal experiences they have with their own symptoms. Patient learned how to identify them and act early on before the symptoms increase.       Group Time: 9:00-10:30 am   Participation Level:  Active  Behavioral Response: Appropriate  Type of Therapy: Psycho-education Group  Summary of Progress:  Patient learned about Anxiety and how to recognize symptoms and act quickly using various coping skills to manage the symptoms.   Carman Ching, LCSW

## 2012-05-04 ENCOUNTER — Other Ambulatory Visit (HOSPITAL_COMMUNITY): Payer: 59

## 2012-05-04 ENCOUNTER — Telehealth (HOSPITAL_COMMUNITY): Payer: Self-pay | Admitting: Psychiatry

## 2012-05-04 NOTE — Progress Notes (Signed)
    Daily Group Progress Note  Program: IOP  Group Time: 9:00-10:30 am   Participation Level: Active  Behavioral Response: Appropriate  Type of Therapy:  Process Group  Summary of Progress: Patient continues to work on his anger. He told the group that he feels that he is doing well with his anger and has not had any angry outbursts lately. He feels that his stress is the main trigger and struggles with coping skills for his anger. In the past he has used drugs or alcohol as coping measures but wants to learn better coping skills. He plans to work on Producer, television/film/video new skills through this group.      Group Time: 10:30 am - 12:00 pm   Participation Level:  Active  Behavioral Response: Appropriate  Type of Therapy: Psycho-education Group  Summary of Progress: Patient learned the DBT skill of Mindfulness and how to use it for emotion regulation and to be more effective in daily interactions.   Carman Ching, LCSW

## 2012-05-06 NOTE — Progress Notes (Signed)
Psychiatric Assessment Adult  Patient Identification:  KINGSLY KLOEPFER Date of Evaluation:  05/01/12 Chief Complaint: History of Chief Complaint:  22 yr old single caucasian male, who was transitioned from [place]. He was admitted there for four days due to severe agitation and communicating a threat. States he made a threat to harm his girlfriend, father and himself. Denies any SI/HI or A/V hallucinations. 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 Reports that he supposed to have been taking one tablet, but he was taking 2-3 tabs. States he had been abusing it for ~ six months. 2) Graduate school ([employer]: Majoring in [place]. Due to poor grades/GPA (2.7) pt has lost his stipend. 3) During Christmas break moved back in with parents due to conflict with roommate. "Being back home has been a huge adjustment." States he and parents argue a lot.  Childhood: "Good" Denies any trauma or abuse. Dx with ADD during senior year.  Sibling: Younger brother who also resides at parent's home.  Drugs/ETOH: Denies any ETOH. Admits to history of using various drugs. Currently admits to [facility] use. Started smoking at age 1. Use to smoke two grams. Last time smoking two grams was two weeks ago. States he smokes less than one gram now. Admits to smoking THC this morning before coming to IOP.  Pt has been seeing Drs. Tomasa Rand and [place] on an outpatient basis for five years   [facility] made the following med changes. -they DC the Vyvanse, Lamictal, and was started on Strattera 40 mg po q d and Trileptal 300mg  TID.  HPI Review of Systems Physical Exam  Depressive Symptoms: psychomotor agitation, feelings of worthlessness/guilt, difficulty concentrating, hopelessness, anxiety,  (Hypo) Manic Symptoms:   Elevated Mood:  No Irritable Mood:  Yes Grandiosity:  No Distractibility:  Yes Labiality of Mood:  Yes Delusions:  No Hallucinations:  No Impulsivity:   Yes Sexually Inappropriate Behavior:  No Financial Extravagance:  No Flight of Ideas:  No  Anxiety Symptoms: Excessive Worry:  Yes Panic Symptoms:  Yes Agoraphobia:  No Obsessive Compulsive: No  Symptoms: None, Specific Phobias:  No Social Anxiety:  Yes  Psychotic Symptoms:  Hallucinations: No None Delusions:  No Paranoia:  No   Ideas of Reference:  No  PTSD Symptoms:NONE Traumatic Brain Injury: No   Past Psychiatric History: Diagnosis: ADHD, Bipolar disorder  Hospitalizations:   Outpatient Care: Dr Tomasa Rand for meds and Dr Letta Kocher for therapy.  Substance Abuse Care:   Self-Mutilation:   Suicidal Attempts:   Violent Behaviors:    Past Medical History:   Past Medical History  Diagnosis Date  . Asthma   . Allergic rhinitis, cause unspecified seaasonal    Allergic rhinitis   History of Loss of Consciousness:  No Seizure History:  No Cardiac History:  No Allergies:   Allergies  Allergen Reactions  . Amoxicillin Hives  . Cefzil (Cefprozil) Hives  . Septra (Sulfamethoxazole-Tmp Ds) Hives   Current Medications:  Current Outpatient Prescriptions  Medication Sig Dispense Refill  . atomoxetine (STRATTERA) 40 MG capsule Take 40 mg by mouth daily.      Marland Kitchen levocetirizine (XYZAL) 5 MG tablet Take 5 mg by mouth every morning.      . montelukast (SINGULAIR) 10 MG tablet Take 10 mg by mouth every morning.      . Oxcarbazepine (TRILEPTAL) 300 MG tablet Take 300 mg by mouth 3 (three) times daily.       No current facility-administered medications for this visit.    Previous  Psychotropic Medications:  Medication Dose   Strattera,Trileptal, Vyvanse, Addereall                       Substance Abuse History in the last 12 months: Substance Age of 1st Use Last Use Amount Specific Type  Nicotine      Alcohol      Cannabis teens today 1 GM /day   Opiates      Cocaine unk unk    Methamphetamines unk 1 yr ago unk   LSD unk 2 yrs ago unk   Ecstasy       Benzodiazepines      Caffeine      Inhalants      Others:                          Medical Consequences of Substance Abuse: none  Legal Consequences of Substance Abuse: none  Family Consequences of Substance Abuse: family conflicts  Blackouts:  No DT's:  No Withdrawal Symptoms:  No None  Social History: Current Place of Residence: Lives with his parents in [place]. Place of Birth:  Family Members:  Marital Status:  Single Children:   Sons:   Daughters:  Relationships:  Education:  [employer] Problems/Performance:  Religious Beliefs/Practices:  History of Abuse: none Teacher, music History:  None. Legal History: none Hobbies/Interests:   Family History:   Family History  Problem Relation Age of Onset  . Alcohol abuse Paternal Uncle   . Drug abuse Paternal Uncle   . Alcohol abuse Maternal Grandfather     Mental Status Examination/Evaluation: Objective:  Appearance: Casual  Eye Contact::  Fair  Speech:  Normal Rate  Volume:  Normal  Mood:  Dysphoric and anxious  Affect:  Constricted and Depressed  Thought Process:  Goal Directed  Orientation:  Full (Time, Place, and Person)  Thought Content:  WDL  Suicidal Thoughts:  No  Homicidal Thoughts:  No  Judgement:  Fair  Insight:  Fair  Psychomotor Activity:  Normal  Akathisia:  No  Handed:  Right  AIMS (if indicated):    Assets:  Communication Skills Desire for Improvement Physical Health Resilience Social Support    Laboratory/X-Ray Psychological Evaluation(s)   done in the hospital     Assessment:  Axis I: Major Depression, Recurrent severe  AXIS I ADHD, combined type, Major Depression, Recurrent severe and Substance Abuse  AXIS II Deferred  AXIS III Past Medical History  Diagnosis Date  . Asthma   . Allergic rhinitis, cause unspecified seaasonal    Allergic rhinitis     AXIS IV educational problems, housing problems, other psychosocial or environmental  problems, problems related to social environment and problems with primary support group  AXIS V 51-60 moderate symptoms   Treatment Plan/Recommendations:  Plan of Care: start IOP  Laboratory:  done on the inpatient unit  Psychotherapy: group and individual therapy  Medications: continue Trileptal and strattera at the above doses.  Routine PRN Medications:  No  Consultations:   Safety Concerns:  none  Other:  Estimated LOS- 2 weeks     Margit Banda, MD  05/01/12 . 4.00 PM.

## 2012-05-07 ENCOUNTER — Other Ambulatory Visit (HOSPITAL_COMMUNITY): Payer: 59 | Attending: Psychiatry | Admitting: Psychiatry

## 2012-05-07 DIAGNOSIS — F329 Major depressive disorder, single episode, unspecified: Secondary | ICD-10-CM

## 2012-05-07 DIAGNOSIS — F3110 Bipolar disorder, current episode manic without psychotic features, unspecified: Secondary | ICD-10-CM | POA: Insufficient documentation

## 2012-05-07 NOTE — Progress Notes (Signed)
    Daily Group Progress Note  Program: IOP  Group Time: 9:00-10:30 am   Participation Level: Active  Behavioral Response: Appropriate  Type of Therapy:  Process Group  Summary of Progress: Patient was a few minutes late and expressed agitation with several small things he felt had already gone wrong this morning that contributed to his "foul" mood. He stated he did not sleep well last night and that may be contributing to his irritability. After listening to others share, he stated he needs to put his life in perspective because others have way worse stressors than he does.      Group Time: 10:30 am - 12:00 pm   Participation Level:  Active  Behavioral Response: Appropriate  Type of Therapy: Psycho-education Group  Summary of Progress: Patient participated in a group on grief and loss and identified losses impacting wellness and discussed healthy ways to grieve.  Carman Ching, LCSW

## 2012-05-08 ENCOUNTER — Telehealth (HOSPITAL_COMMUNITY): Payer: Self-pay | Admitting: Psychiatry

## 2012-05-08 ENCOUNTER — Other Ambulatory Visit (HOSPITAL_COMMUNITY): Payer: 59

## 2012-05-09 ENCOUNTER — Other Ambulatory Visit (HOSPITAL_COMMUNITY): Payer: 59 | Admitting: Psychiatry

## 2012-05-09 DIAGNOSIS — F329 Major depressive disorder, single episode, unspecified: Secondary | ICD-10-CM

## 2012-05-09 MED ORDER — ATOMOXETINE HCL 40 MG PO CAPS: 40.0000 mg | ORAL_CAPSULE | Freq: Two times a day (BID) | ORAL | Status: DC

## 2012-05-09 NOTE — Progress Notes (Signed)
Patient ID: Billy Cooley, male   DOB: 04-Sep-1990, 22 y.o.   MRN: [id number] Pt seen states hes anxious. Discussed discontinuing caffeine and quitting marijuana . And incorporating exercise and a healthy life style.No si/ hi. No hallucinations/ delusions.

## 2012-05-09 NOTE — Progress Notes (Signed)
    Daily Group Progress Note  Program: IOP  Group Time: 9:00-10:30 am   Participation Level: Active  Behavioral Response: Appropriate  Type of Therapy:  Process Group  Summary of Progress: Patient arrived twenty minutes late to group again today despite being asked to come on time. The tx team discussed this as a treatment issue today and it was agreed upon that patient is to be notified that being on time is a necessary part of his treatment and one more tardy will mean discharge from the program. Patient described struggling in school and not being sure he can return. He identified with the feeling of procrastination and motivation.      Group Time: 10:30 am - 12:00 pm   Participation Level:  Active  Behavioral Response: Appropriate  Type of Therapy: Psycho-education Group  Summary of Progress: Patient participated in a discussion on mindfulness in a follow up to the presentation the other day. Patient learned how to sit with painful feelings mindfully with an acceptance instead of a rejection of them.    Carman Ching, LCSW

## 2012-05-10 ENCOUNTER — Telehealth (HOSPITAL_COMMUNITY): Payer: Self-pay | Admitting: Psychiatry

## 2012-05-10 ENCOUNTER — Other Ambulatory Visit (HOSPITAL_COMMUNITY): Payer: 59

## 2012-05-11 ENCOUNTER — Other Ambulatory Visit (HOSPITAL_COMMUNITY): Payer: 59

## 2012-05-14 ENCOUNTER — Other Ambulatory Visit (HOSPITAL_COMMUNITY): Payer: 59 | Admitting: Psychiatry

## 2012-05-14 DIAGNOSIS — F329 Major depressive disorder, single episode, unspecified: Secondary | ICD-10-CM

## 2012-05-14 NOTE — Progress Notes (Signed)
    Daily Group Progress Note  Program: IOP  Group Time: 9:00-10:30 am   Participation Level: Active  Behavioral Response: Appropriate  Type of Therapy:  Process Group  Summary of Progress: Patient reports feeling "good" today. He talked about his struggle with doing well in high school academically with minimal effort and how this caused him to struggle when he began college because the ways he managed classes was not effective anymore. He described the unhealthy coping skills he used that compounded the problem until he stopped attending. He described wanting to learn how to better manage his time and be more effective in college so he can return.      Group Time: 10:30 am - 12:00 pm   Participation Level:  Active  Behavioral Response: Appropriate  Type of Therapy: Psycho-education Group  Summary of Progress: Patient participated in a group on grief and loss and identified personal losses and appropriate grieving strategies.   Carman Ching, LCSW

## 2012-05-15 ENCOUNTER — Other Ambulatory Visit (HOSPITAL_COMMUNITY): Payer: 59 | Admitting: Psychiatry

## 2012-05-16 ENCOUNTER — Other Ambulatory Visit (HOSPITAL_COMMUNITY): Payer: 59 | Admitting: Psychiatry

## 2012-05-16 ENCOUNTER — Telehealth (HOSPITAL_COMMUNITY): Payer: Self-pay | Admitting: Psychiatry

## 2012-05-16 NOTE — Progress Notes (Signed)
    Daily Group Progress Note  Program: IOP  Group Time: 9:00-10:30 am   Participation Level: None  Behavioral Response: None  Type of Therapy:  Process Group  Summary of Progress: Patient arrived twenty minutes late again today after several discussions about needing to be on time. Patient was asked to wait in the lobby until this group ended as a consequence for his continued lateness. Patent met with the case manager to discuss his tardiness and the reason for the intervention.      Group Time: 10:30 am - 12:00 pm   Participation Level:  None  Behavioral Response: None  Type of Therapy: Psycho-education Group  Summary of Progress: Patient chose to leave and was not present for this group.   Carman Ching, LCSW

## 2012-05-16 NOTE — Telephone Encounter (Signed)
Pt arrived late for group today.  As an intervention had to sit in the lobby until break time.  According to pt, he became ill and left. Scheduled d/c tomorrow.

## 2012-05-16 NOTE — Progress Notes (Signed)
    Daily Group Progress Note  Program: IOP  Group Time: 9:00-10:30 am   Participation Level: Active  Behavioral Response: Appropriate  Type of Therapy:  Process Group  Summary of Progress: Patient arrived thirty minutes late again for group today and tardiness has been his normal after repeated discussions with him regarding a need to be on time. Patient stated he struggles with procrastination and this is also what he had difficulty with in college. Members discussed their need with him to be on time and make group a priority and he agreed.      Group Time: 10:30 am - 12:00 pm   Participation Level:  Active  Behavioral Response: Appropriate  Type of Therapy: Psycho-education Group  Summary of Progress: Patient learned the ACCEPTS portion of the DBT skill of Distress Tolerance and how to use this to manage difficult feelings.    Carman Ching, LCSW

## 2012-05-17 ENCOUNTER — Other Ambulatory Visit (HOSPITAL_COMMUNITY): Payer: 59 | Admitting: Psychiatry

## 2012-05-17 DIAGNOSIS — F329 Major depressive disorder, single episode, unspecified: Secondary | ICD-10-CM

## 2012-05-17 NOTE — Progress Notes (Signed)
Discharge Note  Patient:  Billy Cooley is an 22 y.o., male DOB:  03/29/1990  Date of Admission:  (Not on file)  Date of Discharge:  05/17/12  Reason for Admission: Patient is 22 year old Caucasian male who was transitioned from inpatient care.  He was admitted at old [facility] because of increased agitation and to be getting pregnant.  He was threatening to harm his girlfriend father and himself.  There were no psychotic symptoms present.  He was recommended to do intensive outpatient program.  Hospital Course: Patient was admitted in intensive outpatient program.  He was prescribed mood stabilizer Trileptal however patient is not taking the medication.  He does not believe he has any bipolar disorder.  He believe he has ADD.  He admitted taking moderate dose of Vyvanse of the stimulants which was causing his anger.  He's been off from these medication.  He is taking the Strattera which is given by Dr. Karie Schwalbe.  He is also not happy with his previous psychiatrist Dr. Tomasa Rand.  He scheduled to see Dr. Jennelle Human.  He finished the program without any issue.  He felt much improvement in better in group.  He admitted increase coping and social skills.  At this time he does not have any aggression or threatening behavior.  Mental Status at Discharge: Patient is casually dressed and groomed.  His his speech is clear and coherent.  His thought process is logical linear and goal-directed.  He denies any active or passive suicidal thoughts or homicidal thoughts.  He denies any auditory or visual hallucination.  He has some grandiosity but there were no paranoia or delusion present at this time.  There were no flight of ideas or any loose association.  His psychomotor activity is okay.  There were no tremors or shakes.  His insight judgment is fair.  His impulse control is okay.  Lab Results: No results found for this or any previous visit (from the past 48 hour(s)).  Current outpatient  prescriptions:atomoxetine (STRATTERA) 40 MG capsule, Take 1 capsule (40 mg total) by mouth 2 (two) times daily., Disp: 60 capsule, Rfl: 0;  levocetirizine (XYZAL) 5 MG tablet, Take 5 mg by mouth every morning., Disp: , Rfl: ;  montelukast (SINGULAIR) 10 MG tablet, Take 10 mg by mouth every morning., Disp: , Rfl: ;  Oxcarbazepine (TRILEPTAL) 300 MG tablet, Take 300 mg by mouth 3 (three) times daily., Disp: , Rfl:   Axis Diagnosis:  Axis I: Bipolar, Manic and ADD by history   Level of Care:  OP  Discharge destination:  Home  Is patient on multiple antipsychotic therapies at discharge:  No    Has Patient had three or more failed trials of antipsychotic monotherapy by history:  No  Patient phone:  [phone] (home)  Patient address:   [address] [place] [address],   Follow-up recommendations:  Activity:  As tolerated Diet:  Unchanged Other:  Patient will followup with Dr. Jennelle Human and Dr. Letta Kocher next week.  Comments:  None  The patient received suicide prevention pamphlet:  Yes Belongings returned:  Clothing, Medications and Valuables  ARFEEN,SYED T. 05/17/2012, 11:00 AM

## 2012-05-17 NOTE — Progress Notes (Signed)
    Daily Group Progress Note  Program: IOP  Group Time: 9:00-10:30 am   Participation Level: Active  Behavioral Response: Appropriate  Type of Therapy:  Process Group  Summary of Progress: Today is patients last day in the group. He reports making good progress with his depression and states he learned how to change his thoughts to try to see the positive in stressful situations to reduce some of the stress. He also relied heavily on his spirituality as form of support. He arrived on time for group for the first time today and received compliments from group members on this achievement.      Group Time: 10:30 am - 12:00 pm   Participation Level:  Active  Behavioral Response: Appropriate  Type of Therapy: Psycho-education Group  Summary of Progress: Patient participated in goodbye to member ceremonies and practiced the skill of having healthy closure.   Carman Ching, LCSW

## 2012-05-17 NOTE — Progress Notes (Signed)
Patient ID: CURRY SEEFELDT, male   DOB: 1990/11/12, 22 y.o.   MRN: [id number] This is a 22 yr old single caucasian male, who was transitioned from [facility]. He was admitted there for four days due to severe agitation and communicating a threat. States he made a threat to harm his girlfriend, father and himself. Denies any SI/HI or A/V hallucinations. 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 Reports that he supposed to have been taking one tablet, but he was taking 2-3 tabs. States he had been abusing it for ~ six months. 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 Due to poor grades/GPA (2.7) pt has lost his stipend. 3) During Christmas break moved back in with parents due to conflict with roommate. "Being back home has been a huge adjustment." States he and parents argue a lot. Pt completed [facility] today. He states he has found a lot of new tools to help him with his anger and frustration. Pt feels the group was affective and assisted him with many of his stressors. He denies any SI/HI or A/V hallucinations. Pt will follow up with Dr. Jennelle Human and Dr. Beckey Downing.

## 2012-05-17 NOTE — Patient Instructions (Addendum)
Patient will discharge from [facility] today. Follow up with Dr. Jennelle Human and Dr. Beckey Downing.

## 2012-05-18 ENCOUNTER — Other Ambulatory Visit (HOSPITAL_COMMUNITY): Payer: 59

## 2012-05-21 ENCOUNTER — Other Ambulatory Visit (HOSPITAL_COMMUNITY): Payer: 59

## 2012-05-22 ENCOUNTER — Other Ambulatory Visit (HOSPITAL_COMMUNITY): Payer: 59

## 2016-03-28 ENCOUNTER — Encounter (HOSPITAL_COMMUNITY): Payer: Self-pay | Admitting: Family Medicine

## 2016-03-28 ENCOUNTER — Ambulatory Visit (HOSPITAL_COMMUNITY)
Admission: EM | Admit: 2016-03-28 | Discharge: 2016-03-28 | Disposition: A | Discharge status: Home / Self Care | Payer: BLUE CROSS/BLUE SHIELD | Attending: Family Medicine | Admitting: Family Medicine

## 2016-03-28 ENCOUNTER — Ambulatory Visit (INDEPENDENT_AMBULATORY_CARE_PROVIDER_SITE_OTHER): Payer: BLUE CROSS/BLUE SHIELD

## 2016-03-28 ENCOUNTER — Ambulatory Visit (HOSPITAL_COMMUNITY): Payer: BLUE CROSS/BLUE SHIELD

## 2016-03-28 DIAGNOSIS — S9032XA Contusion of left foot, initial encounter: Secondary | ICD-10-CM

## 2016-03-28 DIAGNOSIS — M79605 Pain in left leg: Secondary | ICD-10-CM

## 2016-03-28 NOTE — ED Triage Notes (Signed)
Pt here for let foot injury. sts that he was breaking boards with his foot,.

## 2016-03-28 NOTE — Discharge Instructions (Signed)
Contusion A contusion is a deep bruise. Contusions are the result of a blunt injury to tissues and muscle fibers under the skin. The injury causes bleeding under the skin. The skin overlying the contusion may turn blue, purple, or yellow. Minor injuries will give you a painless contusion, but more severe contusions may stay painful and swollen for a few weeks. What are the causes? This condition is usually caused by a blow, trauma, or direct force to an area of the body. What are the signs or symptoms? Symptoms of this condition include:  Swelling of the injured area.  Pain and tenderness in the injured area.  Discoloration. The area may have redness and then turn blue, purple, or yellow. How is this diagnosed? This condition is diagnosed based on a physical exam and medical history. An X-ray, CT scan, or MRI may be needed to determine if there are any associated injuries, such as broken bones (fractures). How is this treated? Specific treatment for this condition depends on what area of the body was injured. In general, the best treatment for a contusion is resting, icing, applying pressure to (compression), and elevating the injured area. This is often called the RICE strategy. Over-the-counter anti-inflammatory medicines may also be recommended for pain control. Follow these instructions at home:  Rest the injured area.  If directed, apply ice to the injured area:  Put ice in a plastic bag.  Place a towel between your skin and the bag.  Leave the ice on for 20 minutes, 2-3 times per day.  If directed, apply light compression to the injured area using an elastic bandage. Make sure the bandage is not wrapped too tightly. Remove and reapply the bandage as directed by your health care provider.  If possible, raise (elevate) the injured area above the level of your heart while you are sitting or lying down.  Take over-the-counter and prescription medicines only as told by your health  care provider. Contact a health care provider if:  Your symptoms do not improve after several days of treatment.  Your symptoms get worse.  You have difficulty moving the injured area. Get help right away if:  You have severe pain.  You have numbness in a hand or foot.  Your hand or foot turns pale or cold. This information is not intended to replace advice given to you by your health care provider. Make sure you discuss any questions you have with your health care provider. Document Released: 12/01/2004 Document Revised: 07/02/2015 Document Reviewed: 07/09/2014 Elsevier Interactive Patient Education  2017 [employer]  

## 2016-03-28 NOTE — ED Provider Notes (Signed)
CSN: [id number]     Arrival date & time 03/28/16  1431 History   First MD Initiated Contact with Patient 03/28/16 1526     Chief Complaint  Patient presents with  . Foot Injury   (Consider location/radiation/quality/duration/timing/severity/associated sxs/prior Treatment) Patient was breaking boards with foot and has discomfort in his left foot now and hurts to walk on left foot.   The history is provided by the patient.  Foot Injury  Location:  Foot Injury: no   Foot location:  L foot Pain details:    Quality:  Aching   Radiates to:  Does not radiate   Severity:  No pain   Duration:  1 week   Timing:  Constant Chronicity:  New Dislocation: no   Foreign body present:  No foreign bodies Tetanus status:  Unknown Prior injury to area:  No Relieved by:  Nothing Worsened by:  Nothing Ineffective treatments:  None tried   Past Medical History:  Diagnosis Date  . Allergic rhinitis, cause unspecified seaasonal   Allergic rhinitis  . Asthma    Past Surgical History:  Procedure Laterality Date  . adnoids     adnoids removed  . OTHER SURGICAL HISTORY     sinus surgery  . OTHER SURGICAL HISTORY     tubes placed in ears   Family History  Problem Relation Age of Onset  . Alcohol abuse Paternal Uncle   . Drug abuse Paternal Uncle   . Alcohol abuse Maternal Grandfather    Social History  Substance Use Topics  . Smoking status: Never Smoker  . Smokeless tobacco: Never Used  . Alcohol use No    Review of Systems  Constitutional: Negative.   HENT: Negative.   Eyes: Negative.   Respiratory: Negative.   Cardiovascular: Negative.   Gastrointestinal: Negative.   Endocrine: Negative.   Genitourinary: Negative.   Musculoskeletal: Positive for arthralgias.  Allergic/Immunologic: Negative.   Neurological: Negative.   Hematological: Negative.   Psychiatric/Behavioral: Negative.     Allergies  Amoxicillin; Cefzil [cefprozil]; and Septra  [sulfamethoxazole-trimethoprim]  Home Medications   Prior to Admission medications   Medication Sig Start Date End Date Taking? Authorizing Provider  levocetirizine (XYZAL) 5 MG tablet Take 5 mg by mouth every morning.    Historical Provider, MD   Meds Ordered and Administered this Visit  Medications - No data to display  BP 138/79   Pulse 62   Temp 98 F (36.7 C)   Resp 18   SpO2 100%  No data found.   Physical Exam  Constitutional: He appears well-developed and well-nourished.  HENT:  Head: Normocephalic and atraumatic.  Right Ear: External ear normal.  Left Ear: External ear normal.  Mouth/Throat: Oropharynx is clear and moist.  Eyes: Conjunctivae and EOM are normal. Pupils are equal, round, and reactive to light.  Neck: Normal range of motion. Neck supple.  Cardiovascular: Normal rate, regular rhythm and normal heart sounds.   Pulmonary/Chest: Effort normal and breath sounds normal.  Abdominal: Soft. Bowel sounds are normal.  Musculoskeletal: He exhibits tenderness.  TTP left foot no deformity or swelling  Nursing note and vitals reviewed.   Urgent Care Course     Procedures (including critical care time)  Labs Review Labs Reviewed - No data to display  Imaging Review Dg Foot Complete Left  Result Date: 03/28/2016 CLINICAL DATA:  Foot injury breaking words on 03/19/2016, plantar pain of LEFT foot and great toe EXAM: LEFT FOOT - COMPLETE 3+ VIEW COMPARISON:  None FINDINGS:  Osseous mineralization normal. Joint spaces preserved. No acute fracture, dislocation, or bone destruction. IMPRESSION: Normal exam. Electronically Signed   By: Ulyses Southward M.D.   On: 03/28/2016 15:35     Visual Acuity Review  Right Eye Distance:   Left Eye Distance:   Bilateral Distance:    Right Eye Near:   Left Eye Near:    Bilateral Near:         MDM   1. Contusion of left foot, initial encounter   2. Pain of left lower extremity    Cam walker left lower extremity Take  motrin from home 600-800mg  tid x 7-10 days      Deatra Canter, FNP 03/28/16 1642
